# Patient Record
Sex: Female | Born: 2005 | Hispanic: Yes | Marital: Single | Smoking: Never smoker
Health system: Southern US, Community
[De-identification: ages and names within clinical notes are randomized; demographics above are authoritative.]

## PROBLEM LIST (undated history)

## (undated) DIAGNOSIS — R41840 Attention and concentration deficit: Secondary | ICD-10-CM

## (undated) DIAGNOSIS — F909 Attention-deficit hyperactivity disorder, unspecified type: Secondary | ICD-10-CM

## (undated) DIAGNOSIS — J45909 Unspecified asthma, uncomplicated: Secondary | ICD-10-CM

## (undated) HISTORY — DX: Attention-deficit hyperactivity disorder, unspecified type: F90.9

---
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
## 2024-07-30 ENCOUNTER — Encounter (HOSPITAL_COMMUNITY): Payer: Self-pay

## 2024-07-30 ENCOUNTER — Emergency Department (HOSPITAL_COMMUNITY): Admission: EM | Admit: 2024-07-30 | Discharge: 2024-07-30 | Discharge status: Against Medical Advice

## 2024-07-30 ENCOUNTER — Other Ambulatory Visit: Payer: Self-pay

## 2024-07-30 DIAGNOSIS — R1110 Vomiting, unspecified: Secondary | ICD-10-CM | POA: Diagnosis present

## 2024-07-30 DIAGNOSIS — Z5321 Procedure and treatment not carried out due to patient leaving prior to being seen by health care provider: Secondary | ICD-10-CM | POA: Insufficient documentation

## 2024-07-30 LAB — COMPREHENSIVE METABOLIC PANEL WITH GFR
ALT: 72 U/L — ABNORMAL HIGH (ref 0–44)
AST: 36 U/L (ref 15–41)
Albumin: 4.5 g/dL (ref 3.5–5.0)
Alkaline Phosphatase: 44 U/L (ref 38–126)
Anion gap: 14 (ref 5–15)
BUN: 9 mg/dL (ref 6–20)
CO2: 20 mmol/L — ABNORMAL LOW (ref 22–32)
Calcium: 9.2 mg/dL (ref 8.9–10.3)
Chloride: 102 mmol/L (ref 98–111)
Creatinine, Ser: 0.68 mg/dL (ref 0.44–1.00)
GFR, Estimated: >60 mL/min
Glucose, Bld: 106 mg/dL — ABNORMAL HIGH (ref 70–99)
Potassium: 4.3 mmol/L (ref 3.5–5.1)
Sodium: 136 mmol/L (ref 135–145)
Total Bilirubin: 0.6 mg/dL (ref 0.0–1.2)
Total Protein: 7.7 g/dL (ref 6.5–8.1)

## 2024-07-30 LAB — CBC WITH DIFFERENTIAL/PLATELET
Abs Immature Granulocytes: 0.03 K/uL (ref 0.00–0.07)
Basophils Absolute: 0.1 K/uL (ref 0.0–0.1)
Basophils Relative: 1 %
Eosinophils Absolute: 0 K/uL (ref 0.0–0.5)
Eosinophils Relative: 0 %
HCT: 40.4 % (ref 36.0–46.0)
Hemoglobin: 13.9 g/dL (ref 12.0–15.0)
Immature Granulocytes: 0 %
Lymphocytes Relative: 16 %
Lymphs Abs: 1.4 K/uL (ref 0.7–4.0)
MCH: 30.2 pg (ref 26.0–34.0)
MCHC: 34.4 g/dL (ref 30.0–36.0)
MCV: 87.6 fL (ref 80.0–100.0)
Monocytes Absolute: 0.5 K/uL (ref 0.1–1.0)
Monocytes Relative: 6 %
Neutro Abs: 6.7 K/uL (ref 1.7–7.7)
Neutrophils Relative %: 77 %
Platelets: 407 K/uL — ABNORMAL HIGH (ref 150–400)
RBC: 4.61 MIL/uL (ref 3.87–5.11)
RDW: 11.5 % (ref 11.5–15.5)
WBC: 8.6 K/uL (ref 4.0–10.5)
nRBC: 0 % (ref 0.0–0.2)

## 2024-07-30 LAB — LIPASE, BLOOD: Lipase: 29 U/L (ref 11–51)

## 2024-07-30 LAB — HCG, SERUM, QUALITATIVE: Preg, Serum: NEGATIVE

## 2024-07-30 MED ORDER — ONDANSETRON 4 MG PO TBDP
4.0000 mg | ORAL_TABLET | Freq: Once | ORAL | Status: AC
  Administered 2024-07-30: 4 mg via ORAL
  Filled 2024-07-30: qty 1

## 2024-07-30 NOTE — ED Triage Notes (Signed)
 Pt to er, pt states that she has a lot to drink last night, states that she is here today because she has vomited 6 times, has been hot and cold and can't keept anything down.

## 2024-07-30 NOTE — ED Provider Triage Note (Signed)
 Emergency Medicine Provider Triage Evaluation Note  Saisha Hogue , a 19 y.o. female  was evaluated in triage.  Pt complains of nausea/vomiting and abdominal pain.  Notes it was her first time drinking alcohol last evening and she has felt very poorly today.  Has vomited 6 times.  Has not had any diarrhea.  Denies fevers.  No previous abdominal surgeries.  Review of Systems  Positive: See above Negative: Fevers, syncope, chest pain, shortness of breath  Physical Exam  BP 136/75 (BP Location: Right Arm)   Pulse (!) 108   Temp 97.6 F (36.4 C)   Resp 15   LMP 06/17/2024 (Approximate)   SpO2 96%  Gen:   Awake, no distress   Resp:  Normal effort  MSK:   Moves extremities without difficulty  Other:    Medical Decision Making  Medically screening exam initiated at 4:54 PM.  Appropriate orders placed.  Nygeria Lager was informed that the remainder of the evaluation will be completed by another provider, this initial triage assessment does not replace that evaluation, and the importance of remaining in the ED until their evaluation is complete.     Neysa Thersia RAMAN, [place] 07/30/24 [id number]

## 2024-07-30 NOTE — ED Notes (Signed)
 Pt unable to give urine sample at this time

## 2024-07-30 NOTE — ED Notes (Signed)
 Pt gives verbal consent for mse
